# Patient Record
Sex: Male | Born: 2000 | Race: White | Hispanic: No | Marital: Single | Smoking: Never smoker
Health system: Southern US, Community
[De-identification: ages and names within clinical notes are randomized; demographics above are authoritative.]

## PROBLEM LIST (undated history)

## (undated) DIAGNOSIS — R000 Tachycardia, unspecified: Secondary | ICD-10-CM

## (undated) DIAGNOSIS — F909 Attention-deficit hyperactivity disorder, unspecified type: Secondary | ICD-10-CM

## (undated) DIAGNOSIS — G43909 Migraine, unspecified, not intractable, without status migrainosus: Secondary | ICD-10-CM

## (undated) HISTORY — PX: TYMPANOSTOMY TUBE PLACEMENT: SHX32

---
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
## 2022-01-29 IMAGING — CT CT HEAD W/O CM
3 series · 15 of 47 positions shown, 18 images · non-contrast
Comparison: 12/07/2019

CLINICAL DATA: Blunt trauma to the head while playing virtual
reality game, initial encounter

EXAM:
CT HEAD WITHOUT CONTRAST
CT MAXILLOFACIAL WITHOUT CONTRAST
TECHNIQUE: Multidetector CT imaging of the head and maxillofacial structures
were performed using the standard protocol without intravenous
contrast. Multiplanar CT image reconstructions of the maxillofacial
structures were also generated.

[Series 2: head wo · axial · 0.42mm/px · z∈[-64,+61]mm · 9 of 30 slices shown, 12 images]
[im 3/30  brain]
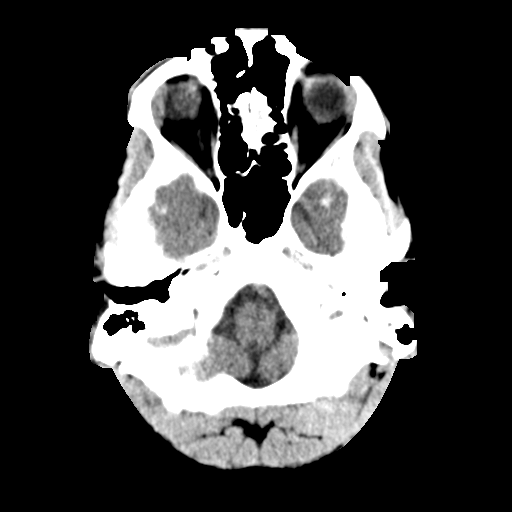
[im 3/30  bone]
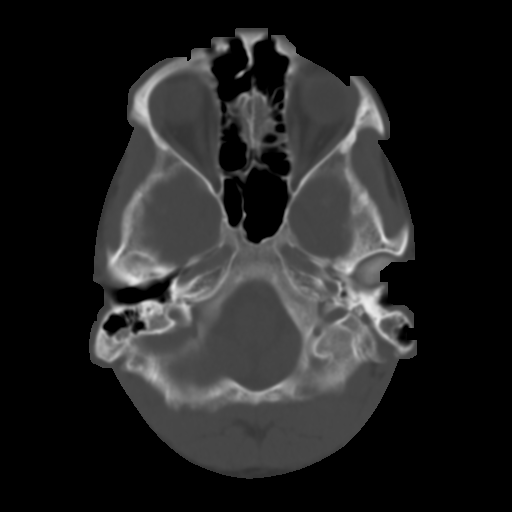
[im 6/30  brain]
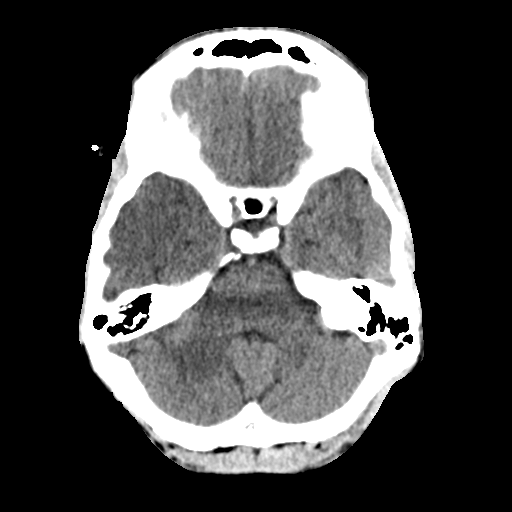
[im 9/30  brain]
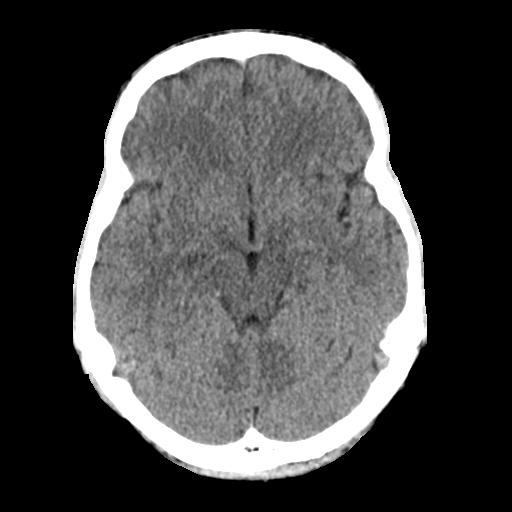
[im 12/30  brain]
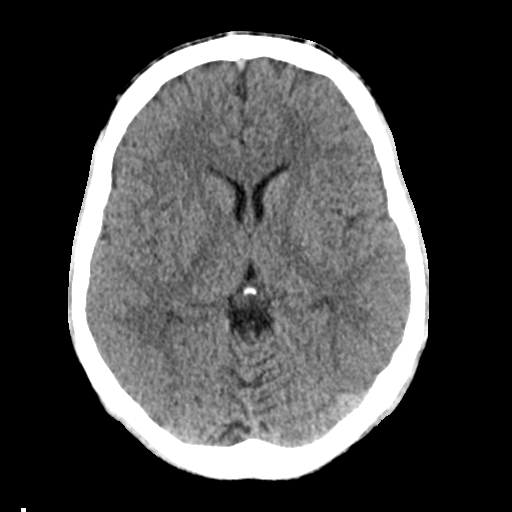
[im 16/30  brain]
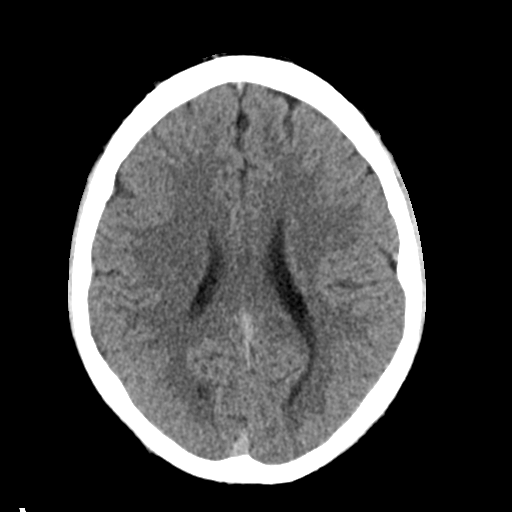
[im 16/30  bone]
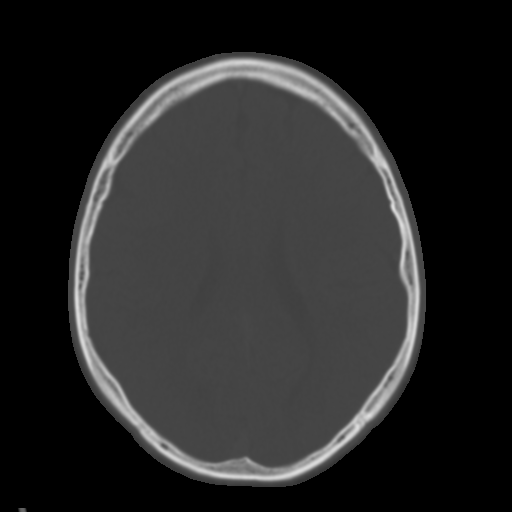
[im 19/30  brain]
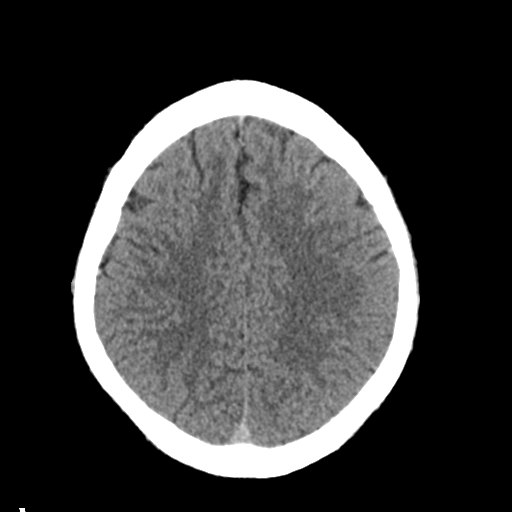
[im 22/30  brain]
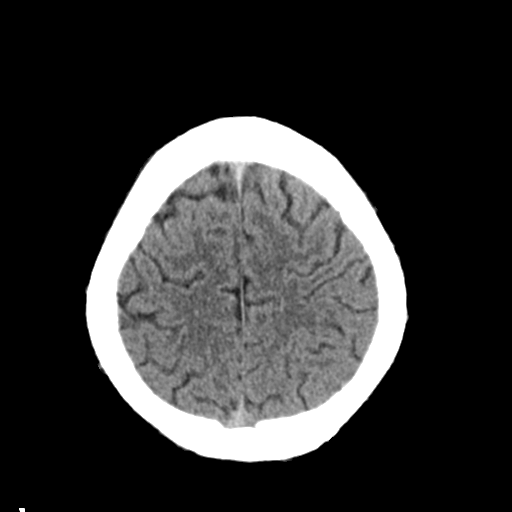
[im 25/30  brain]
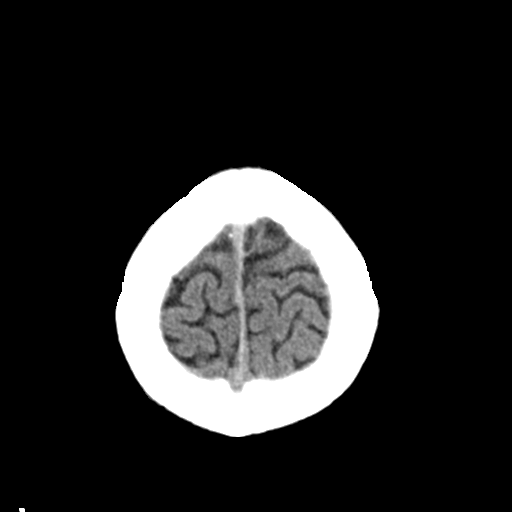
[im 28/30  brain]
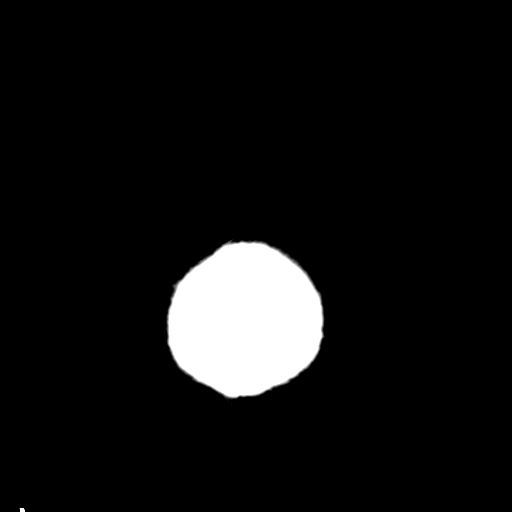
[im 28/30  bone]
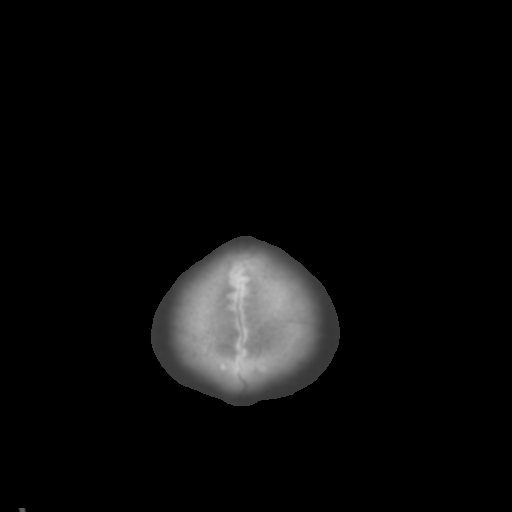

[Series 4: coronal soft tissue · coronal · 0.31mm/px · 3 of 67 slices shown]
[im 23/67  brain]
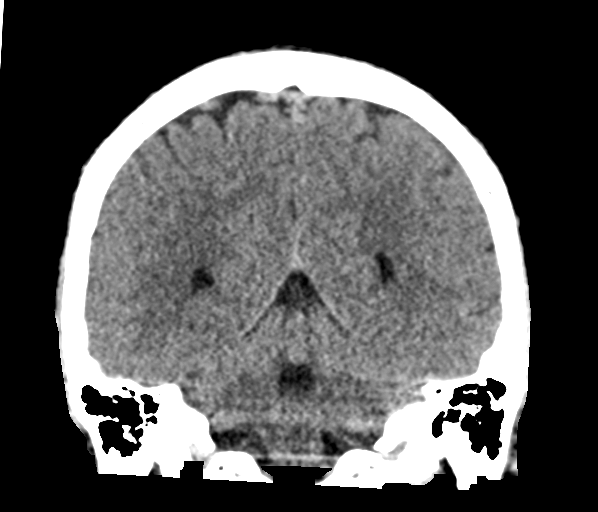
[im 30/67  brain]
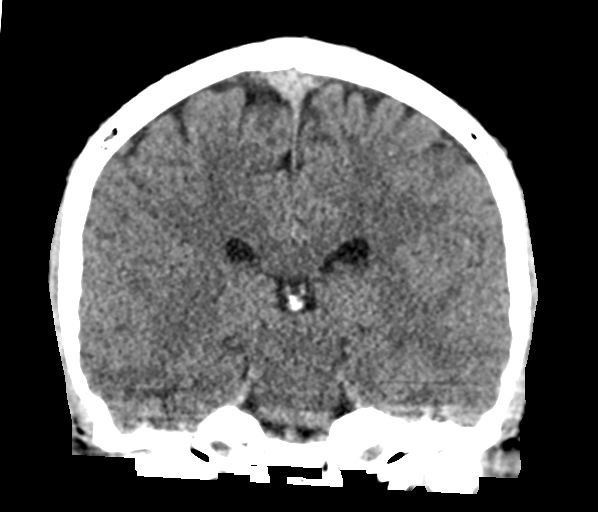
[im 37/67  brain]
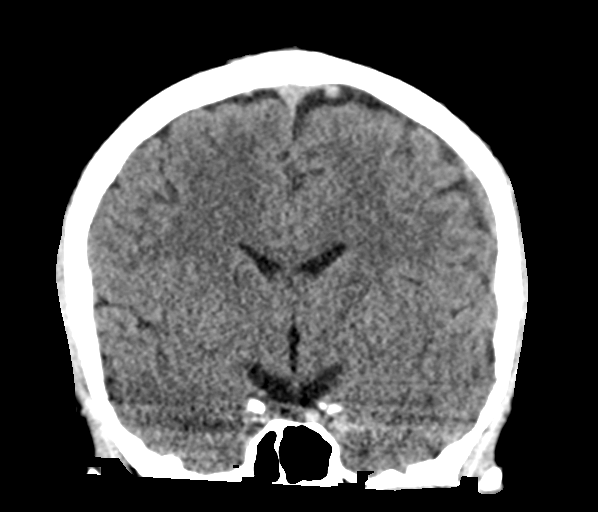

[Series 5: sagittal soft tissue · sagittal · 0.31mm/px · 3 of 56 slices shown]
[im 19/56  brain]
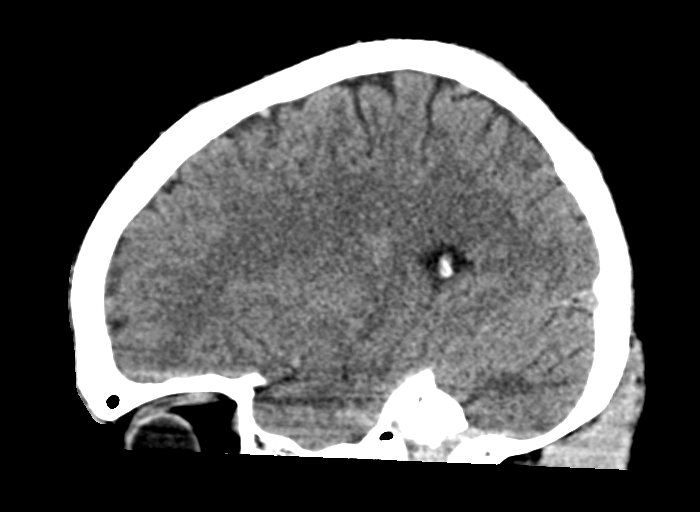
[im 28/56  brain]
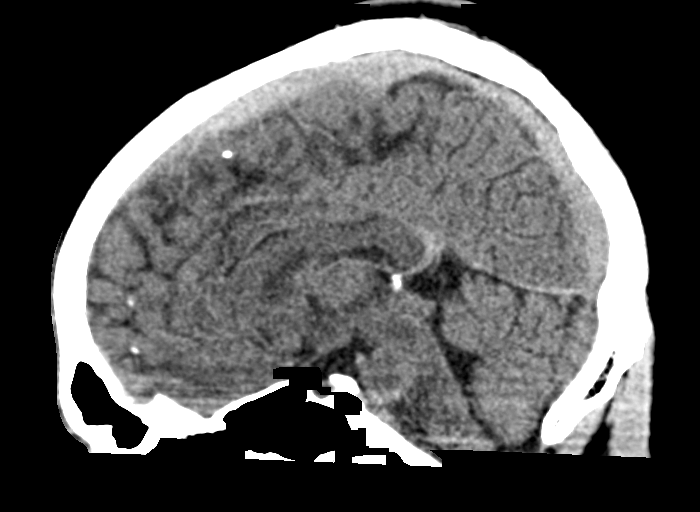
[im 37/56  brain]
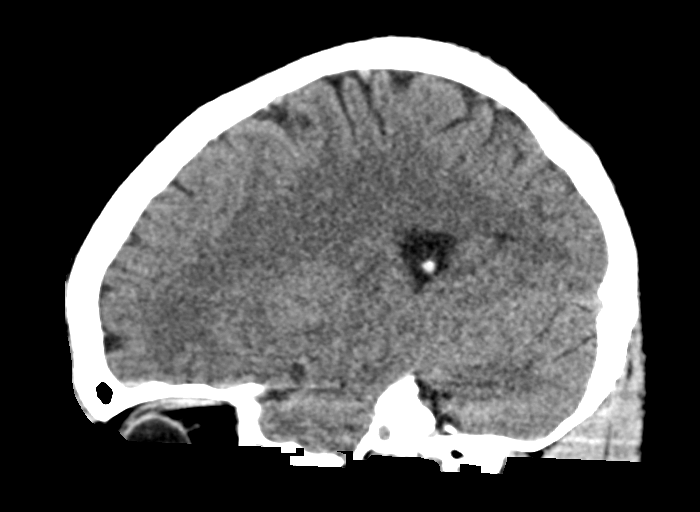

[15 of 47 positions shown; findings below may reference images not displayed]

FINDINGS: CT HEAD FINDINGS

Brain: No evidence of acute infarction, hemorrhage, hydrocephalus,
extra-axial collection or mass lesion/mass effect.

Vascular: No hyperdense vessel or unexpected calcification.

Skull: Normal. Negative for fracture or focal lesion.

Other: None.

CT MAXILLOFACIAL FINDINGS

Osseous: No fracture or mandibular dislocation. No destructive
process.

Orbits: Negative. No traumatic or inflammatory finding.

Sinuses: Clear.

Soft tissues: Negative.
IMPRESSION: CT of the head: No acute intracranial abnormality noted.

CT of the maxillofacial bones: No acute abnormality noted.

## 2023-11-19 ENCOUNTER — Other Ambulatory Visit: Payer: Self-pay | Admitting: Family Medicine

## 2023-11-19 DIAGNOSIS — R748 Abnormal levels of other serum enzymes: Secondary | ICD-10-CM

## 2023-11-26 ENCOUNTER — Ambulatory Visit
Admission: RE | Admit: 2023-11-26 | Discharge: 2023-11-26 | Disposition: A | Discharge status: Home / Self Care | Payer: MEDICAID | Source: Ambulatory Visit | Attending: Family Medicine | Admitting: Family Medicine

## 2023-11-26 DIAGNOSIS — R748 Abnormal levels of other serum enzymes: Secondary | ICD-10-CM | POA: Diagnosis present
